# Patient Record
Sex: Male | Born: 1995 | Race: Black or African American | Hispanic: No | Smoking: Never smoker
Health system: Southern US, Community
[De-identification: ages and names within clinical notes are randomized; demographics above are authoritative.]

## PROBLEM LIST (undated history)

## (undated) HISTORY — PX: OTHER SURGICAL HISTORY: SHX169

---

## 2014-12-19 ENCOUNTER — Emergency Department (HOSPITAL_COMMUNITY): Payer: Medicaid Other

## 2014-12-19 ENCOUNTER — Emergency Department (HOSPITAL_COMMUNITY)
Admission: EM | Admit: 2014-12-19 | Discharge: 2014-12-19 | Disposition: A | Discharge status: Home / Self Care | Payer: Medicaid Other | Attending: Emergency Medicine | Admitting: Emergency Medicine

## 2014-12-19 ENCOUNTER — Encounter (HOSPITAL_COMMUNITY): Payer: Self-pay | Admitting: Nurse Practitioner

## 2014-12-19 DIAGNOSIS — R52 Pain, unspecified: Secondary | ICD-10-CM

## 2014-12-19 DIAGNOSIS — Y9389 Activity, other specified: Secondary | ICD-10-CM | POA: Insufficient documentation

## 2014-12-19 DIAGNOSIS — Y92410 Unspecified street and highway as the place of occurrence of the external cause: Secondary | ICD-10-CM | POA: Insufficient documentation

## 2014-12-19 DIAGNOSIS — S79912A Unspecified injury of left hip, initial encounter: Secondary | ICD-10-CM | POA: Insufficient documentation

## 2014-12-19 DIAGNOSIS — Y998 Other external cause status: Secondary | ICD-10-CM | POA: Diagnosis not present

## 2014-12-19 MED ORDER — IBUPROFEN 800 MG PO TABS: 800.0000 mg | ORAL_TABLET | Freq: Three times a day (TID) | ORAL | Status: AC | PRN
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
## 2014-12-19 NOTE — ED Notes (Signed)
Declined W/C at D/C and was escorted to lobby by RN. 

## 2015-01-24 ENCOUNTER — Emergency Department (HOSPITAL_COMMUNITY)
Admission: EM | Admit: 2015-01-24 | Discharge: 2015-01-24 | Disposition: A | Discharge status: Home / Self Care | Payer: Medicaid Other | Attending: Emergency Medicine | Admitting: Emergency Medicine

## 2015-01-24 ENCOUNTER — Encounter (HOSPITAL_COMMUNITY): Payer: Self-pay | Admitting: Emergency Medicine

## 2015-01-24 DIAGNOSIS — B009 Herpesviral infection, unspecified: Secondary | ICD-10-CM | POA: Diagnosis not present

## 2015-01-24 DIAGNOSIS — Z202 Contact with and (suspected) exposure to infections with a predominantly sexual mode of transmission: Secondary | ICD-10-CM | POA: Insufficient documentation

## 2015-01-24 DIAGNOSIS — R21 Rash and other nonspecific skin eruption: Secondary | ICD-10-CM | POA: Diagnosis not present

## 2015-01-24 LAB — URINALYSIS, ROUTINE W REFLEX MICROSCOPIC
Bilirubin Urine: NEGATIVE
Glucose, UA: NEGATIVE mg/dL
HGB URINE DIPSTICK: NEGATIVE
Ketones, ur: NEGATIVE mg/dL
Leukocytes, UA: NEGATIVE
Nitrite: NEGATIVE
Protein, ur: 30 mg/dL — AB
SPECIFIC GRAVITY, URINE: 1.029 (ref 1.005–1.030)
Urobilinogen, UA: 1 mg/dL (ref 0.0–1.0)
pH: 8 (ref 5.0–8.0)

## 2015-01-24 LAB — URINE MICROSCOPIC-ADD ON

## 2015-01-24 MED ORDER — LIDOCAINE HCL (PF) 1 % IJ SOLN
0.9000 mL | Freq: Once | INTRAMUSCULAR | Status: AC
  Administered 2015-01-24: 0.9 mL
  Filled 2015-01-24: qty 5

## 2015-01-24 MED ORDER — AZITHROMYCIN 250 MG PO TABS
1000.0000 mg | ORAL_TABLET | Freq: Once | ORAL | Status: AC
Start: 2015-01-24 — End: 2015-01-24
  Administered 2015-01-24: 1000 mg via ORAL
  Filled 2015-01-24: qty 4

## 2015-01-24 MED ORDER — VALACYCLOVIR HCL 1 G PO TABS: 1000.0000 mg | ORAL_TABLET | Freq: Two times a day (BID) | ORAL | Status: AC

## 2015-01-24 MED ORDER — CEFTRIAXONE SODIUM 250 MG IJ SOLR
250.0000 mg | Freq: Once | INTRAMUSCULAR | Status: AC
  Administered 2015-01-24: 250 mg via INTRAMUSCULAR
  Filled 2015-01-24: qty 250

## 2015-01-24 NOTE — Discharge Instructions (Signed)
[facility] for future [facility]D concerns or screenings. This is the recommendation by the CDC for people with multiple sexual partners or hx of STDs. You have been treated for gonorrhea and chlamydia in the ER but the hospital will call you if lab is positive. You were tested for HIV and Syphilis, and the hospital will call you if the lab is positive.   You are also being treated for herpes. Take medication as directed. Follow up with [place] and wellness for ongoing management of this symptom. Return to the ER for changes or worsening symptoms   Genital Herpes Genital herpes is a sexually transmitted disease. This means that it is a disease passed by having sex with an infected person. There is no cure for genital herpes. The time between attacks can be months to years. The virus may live in a person but produce no problems (symptoms). This infection can be passed to a baby as it travels down the birth canal (vagina). In a newborn, this can cause central nervous system damage, eye damage, or even death. The virus that causes genital herpes is usually HSV-2 virus. The virus that causes oral herpes is usually HSV-1. The diagnosis (learning what is wrong) is made through culture results. SYMPTOMS  Usually symptoms of pain and itching begin a few days to a week after contact. It first appears as small blisters that progress to small painful ulcers which then scab over and heal after several days. It affects the outer genitalia, birth canal, cervix, penis, anal area, buttocks, and thighs. HOME CARE INSTRUCTIONS   Keep ulcerated areas dry and clean.  Take medications as directed. Antiviral medications can speed up healing. They will not prevent recurrences or cure this infection. These medications can also be taken for suppression if there are frequent recurrences.  While the infection is active, it is contagious. Avoid all sexual contact during active  infections.  Condoms may help prevent spread of the herpes virus.  Practice safe sex.  Wash your hands thoroughly after touching the genital area.  Avoid touching your eyes after touching your genital area.  Inform your caregiver if you have had genital herpes and become pregnant. It is your responsibility to insure a safe outcome for your baby in this pregnancy.  Only take over-the-counter or prescription medicines for pain, discomfort, or fever as directed by your caregiver. SEEK MEDICAL CARE IF:   You have a recurrence of this infection.  You do not respond to medications and are not improving.  You have new sources of pain or discharge which have changed from the original infection.  You have an oral temperature above 102 F (38.9 C).  You develop abdominal pain.  You develop eye pain or signs of eye infection. Document Released: 09/01/2000 Document Revised: 11/27/2011 Document Reviewed: 09/22/2009 [facility]Exit[facility] Patient Information 2015 [place], [place]. This information is not intended to replace advice given to you by your health care provider. Make sure you discuss any questions you have with your health care provider.  Sexually Transmitted Disease A sexually transmitted disease (STD) is a disease or infection that may be passed (transmitted) from person to person, usually during sexual activity. This may happen by way of saliva, semen, blood, vaginal mucus, or urine. Common STDs include:   Gonorrhea.   Chlamydia.   Syphilis.   HIV and AIDS.   Genital herpes.   Hepatitis B and C.   Trichomonas.   Human papillomavirus (HPV).  Pubic lice.   Scabies.  Mites.  Bacterial vaginosis. WHAT ARE CAUSES OF STDs? An STD may be caused by bacteria, a virus, or parasites. STDs are often transmitted during sexual activity if one person is infected. However, they may also be transmitted through nonsexual means. STDs may be transmitted after:   Sexual intercourse  with an infected person.   Sharing sex toys with an infected person.   Sharing needles with an infected person or using unclean piercing or tattoo needles.  Having intimate contact with the genitals, mouth, or rectal areas of an infected person.   Exposure to infected fluids during birth. WHAT ARE THE SIGNS AND SYMPTOMS OF STDs? Different STDs have different symptoms. Some people may not have any symptoms. If symptoms are present, they may include:   Painful or bloody urination.   Pain in the pelvis, abdomen, vagina, anus, throat, or eyes.   A skin rash, itching, or irritation.  Growths, ulcerations, blisters, or sores in the genital and anal areas.  Abnormal vaginal discharge with or without bad odor.   Penile discharge in men.   Fever.   Pain or bleeding during sexual intercourse.   Swollen glands in the groin area.   Yellow skin and eyes (jaundice). This is seen with hepatitis.   Swollen testicles.  Infertility.  Sores and blisters in the mouth. HOW ARE STDs DIAGNOSED? To make a diagnosis, your health care provider may:   Take a medical history.   Perform a physical exam.   Take a sample of any discharge to examine.  Swab the throat, cervix, opening to the penis, rectum, or vagina for testing.  Test a sample of your first morning urine.   Perform blood tests.   Perform a Pap test, if this applies.   Perform a colposcopy.   Perform a laparoscopy.  HOW ARE STDs TREATED? Treatment depends on the STD. Some STDs may be treated but not cured.   Chlamydia, gonorrhea, trichomonas, and syphilis can be cured with antibiotic medicine.   Genital herpes, hepatitis, and HIV can be treated, but not cured, with prescribed medicines. The medicines lessen symptoms.   Genital warts from HPV can be treated with medicine or by freezing, burning (electrocautery), or surgery. Warts may come back.   HPV cannot be cured with medicine or surgery.  However, abnormal areas may be removed from the cervix, vagina, or vulva.   If your diagnosis is confirmed, your recent sexual partners need treatment. This is true even if they are symptom-free or have a negative culture or evaluation. They should not have sex until their health care providers say it is okay. HOW CAN I REDUCE MY RISK OF GETTING AN STD? Take these steps to reduce your risk of getting an STD:  Use latex condoms, dental dams, and water-soluble lubricants during sexual activity. Do not use petroleum jelly or oils.  Avoid having multiple sex partners.  Do not have sex with someone who has other sex partners.  Do not have sex with anyone you do not know or who is at high risk for an STD.  Avoid risky sex practices that can break your skin.  Do not have sex if you have open sores on your mouth or skin.  Avoid drinking too much alcohol or taking illegal drugs. Alcohol and drugs can affect your judgment and put you in a vulnerable position.  Avoid engaging in oral and anal sex acts.  Get vaccinated for HPV and hepatitis. If you have not received  these vaccines in the past, talk to your health care provider about whether one or both might be right for you.   If you are at risk of being infected with HIV, it is recommended that you take a prescription medicine daily to prevent HIV infection. This is called pre-exposure prophylaxis (PrEP). You are considered at risk if:  You are a man who has sex with other men (MSM).  You are a heterosexual man or woman and are sexually active with more than one partner.  You take drugs by injection.  You are sexually active with a partner who has HIV.  Talk with your health care provider about whether you are at high risk of being infected with HIV. If you choose to begin PrEP, you should first be tested for HIV. You should then be tested every 3 months for as long as you are taking PrEP.  WHAT SHOULD I DO IF I THINK I HAVE AN  STD?  See your health care provider.   Tell your sexual partner(s). They should be tested and treated for any STDs.  Do not have sex until your health care provider says it is okay. WHEN SHOULD I GET IMMEDIATE MEDICAL CARE? Contact your health care provider right away if:   You have severe abdominal pain.  You are a man and notice swelling or pain in your testicles.  You are a woman and notice swelling or pain in your vagina. Document Released: 11/25/2002 Document Revised: 09/09/2013 Document Reviewed: 03/25/2013 [facility] Patient Information 2015 [place], [place]. This information is not intended to replace advice given to you by your health care provider. Make sure you discuss any questions you have with your health care provider.  Safe Sex Safe sex is about reducing the risk of giving or getting a sexually transmitted disease (STD). STDs are spread through sexual contact involving the genitals, mouth, or rectum. Some STDs can be cured and others cannot. Safe sex can also prevent unintended pregnancies.  WHAT ARE SOME SAFE SEX PRACTICES?  Limit your sexual activity to only one partner who is having sex with only you.  Talk to your partner about his or her past partners, past STDs, and drug use.  Use a condom every time you have sexual intercourse. This includes vaginal, oral, and anal sexual activity. Both females and males should wear condoms during oral sex. Only use latex or polyurethane condoms and water-based lubricants. Using petroleum-based lubricants or oils to lubricate a condom will weaken the condom and increase the chance that it will break. The condom should be in place from the beginning to the end of sexual activity. Wearing a condom reduces, but does not completely eliminate, your risk of getting or giving an STD. STDs can be spread by contact with infected body fluids and skin.  Get vaccinated for hepatitis B and HPV.  Avoid alcohol and recreational drugs, which can  affect your judgment. You may forget to use a condom or participate in high-risk sex.  For females, avoid douching after sexual intercourse. Douching can spread an infection farther into the reproductive tract.  Check your body for signs of sores, blisters, rashes, or unusual discharge. See your health care provider if you notice any of these signs.  Avoid sexual contact if you have symptoms of an infection or are being treated for an STD. If you or your partner has herpes, avoid sexual contact when blisters are present. Use condoms at all other times.  If you are at risk of  being infected with HIV, it is recommended that you take a prescription medicine daily to prevent HIV infection. This is called pre-exposure prophylaxis (PrEP). You are considered at risk if:  You are a man who has sex with other men (MSM).  You are a heterosexual man or woman who is sexually active with more than one partner.  You take drugs by injection.  You are sexually active with a partner who has HIV.  Talk with your health care provider about whether you are at high risk of being infected with HIV. If you choose to begin PrEP, you should first be tested for HIV. You should then be tested every 3 months for as long as you are taking PrEP.  See your health care provider for regular screenings, exams, and tests for other STDs. Before having sex with a new partner, each of you should be screened for STDs and should talk about the results with each other. WHAT ARE THE BENEFITS OF SAFE SEX?   There is less chance of getting or giving an STD.  You can prevent unwanted or unintended pregnancies.  By discussing safe sex concerns with your partner, you may increase feelings of intimacy, comfort, trust, and honesty between the two of you. Document Released: 10/12/2004 Document Revised: 01/19/2014 Document Reviewed: 02/26/2012 [facility] Patient Information 2015 [place], [place]. This information is not intended to replace  advice given to you by your health care provider. Make sure you discuss any questions you have with your health care provider.

## 2015-01-24 NOTE — ED Provider Notes (Signed)
CSN: [id number]     Arrival date & time 01/24/15  1407 History  This chart was scribed for Levi StraussMercedes Camprubi-Soms PA-C working with Blake DivineJohn Wofford, MD by Elveria Risingimelie Horne, ED Scribe. This patient was seen in room TR04C/TR04C and the patient's care was started at 2:37 PM.   Chief Complaint  Patient presents with  . Exposure to STD  . Rash   Patient is a 19 y.o. male presenting with STD exposure and rash. The history is provided by the patient. No language interpreter was used.  Exposure to STD This is a new problem. Episode onset: unknown. The problem occurs constantly. The problem has not changed since onset.Pertinent negatives include no chest pain, no abdominal pain and no shortness of breath. Nothing aggravates the symptoms. Nothing relieves the symptoms. He has tried nothing for the symptoms. The treatment provided no relief.  Rash Associated symptoms: no abdominal pain, no diarrhea, no fever, no joint pain, no myalgias, no nausea, no shortness of breath and not vomiting    HPI Comments: Darald Jenelle MagesHairston is a [age] y.o. male who presents to the Emergency Department complaining of nonpruritic, nonpainful, nonerythematous genital rash that looks like small bumps. Patient shares having unprotected sex with single male partner for one year; she was recently diagnosed with Chlamydia after experiencing ongoing vaginal discharge. Patient denies fever, chills, eye redness or pain, shortness of breath, chest pain, abdominal pain, nausea, vomiting, dysuria, hematuria, penile pain or discharge, penile itching, testicular pain or swelling, arthralgias, myalgias, numbness, tingling or weakness, or genital ulcers.     History reviewed. No pertinent past medical history. Past Surgical History  Procedure Laterality Date  . Arm surgery Right    No family history on file. History  Substance Use Topics  . Smoking status: Never Smoker   . Smokeless tobacco: Not on file  . Alcohol Use: No    Review of Systems   Constitutional: Negative for fever and chills.  Eyes: Negative for pain and redness.  Respiratory: Negative for shortness of breath.   Cardiovascular: Negative for chest pain.  Gastrointestinal: Negative for nausea, vomiting, abdominal pain and diarrhea.  Genitourinary: Positive for genital sores ("bumps"). Negative for dysuria, hematuria, discharge, penile swelling, scrotal swelling, penile pain and testicular pain.  Musculoskeletal: Negative for myalgias, joint swelling and arthralgias.  Skin: Positive for rash (penis). Negative for color change.  Allergic/Immunologic: Negative for immunocompromised state.  Neurological: Negative for weakness and numbness.  Psychiatric/Behavioral: Negative for confusion.  A complete 10 system review of systems was obtained and all systems are negative except as noted in the HPI and PMH.    Allergies  Review of patient's allergies indicates no known allergies.  Home Medications   Prior to Admission medications   Medication Sig Start Date End Date Taking? Authorizing Provider  ibuprofen (ADVIL,MOTRIN) 800 MG tablet Take 1 tablet (800 mg total) by mouth every 8 (eight) hours as needed for mild pain or moderate pain. 12/19/14   Trixie DredgeEmily West, PA-C   Triage Vitals: BP 116/68 mmHg  Pulse 87  Temp(Src) 98 F (36.7 C) (Oral)  Resp 17  Ht 5\' 10"  (1.778 m)  Wt 120 lb 3.2 oz (54.522 kg)  BMI 17.25 kg/m2  SpO2 97% Physical Exam  Constitutional: He is oriented to person, place, and time. Vital signs are normal. He appears well-developed and well-nourished.  Non-toxic appearance. No distress.  Afebrile, nontoxic, NAD  HENT:  Head: Normocephalic and atraumatic.  Mouth/Throat: Mucous membranes are normal.  Eyes: Conjunctivae and EOM are normal.  Right eye exhibits no discharge. Left eye exhibits no discharge.  Neck: Normal range of motion. Neck supple.  Cardiovascular: Normal rate.   Pulmonary/Chest: Effort normal. No respiratory distress.  Abdominal: Normal  appearance. He exhibits no distension. Hernia confirmed negative in the right inguinal area and confirmed negative in the left inguinal area.  Genitourinary: Testes normal. Right testis shows no mass, no swelling and no tenderness. Left testis shows no mass, no swelling and no tenderness. Circumcised. No phimosis, paraphimosis, hypospadias, penile erythema or penile tenderness. No discharge found.  Chaperone present for exam Circumcised penis without phimosis/paraphimosis, hypospadias, erythema, tenderness, or discharge. Testes with no masses or tenderness, no swelling, and cremasterics reflex present bilaterally. No inguinal hernias or adenopathy present. Diffuse somewhat fine-appearing vesicular rash over entire shaft of penis, with no erythema or drainage, and no TTP. No ulcerations noted. No central umbilication of lesions  Musculoskeletal: Normal range of motion.  Neurological: He is alert and oriented to person, place, and time. He has normal strength. No sensory deficit.  Skin: Skin is warm, dry and intact. No rash noted.  Psychiatric: He has a normal mood and affect.  Nursing note and vitals reviewed.   ED Course  Procedures (including critical care time)  COORDINATION OF CARE: 2:43 PM- Discussed treatment plan with patient at bedside and patient agreed to plan.   Labs Review Labs Reviewed  URINALYSIS, ROUTINE W REFLEX MICROSCOPIC - Abnormal; Notable for the following:    Color, Urine AMBER (*)    Protein, ur 30 (*)    All other components within normal limits  URINE CULTURE  URINE MICROSCOPIC-ADD ON  RPR  HIV ANTIBODY (ROUTINE TESTING)  GC/CHLAMYDIA PROBE AMP ([place])    Imaging Review No results found.   EKG Interpretation None      MDM   Final diagnoses:  Exposure to STD  Penile rash  Herpes    19 y.o. male here with concern about STD exposure. Will obtain STD labs and treat empirically, although pt has no symptoms. He does however have a fine slightly  vesicular rash to his penis shaft, nonpainful or erythematous, does not have ulcerations or central umbilications, doesn't entirely appear like herpes although this could be early herpes. Will treat as this, but can't culture since there are no large vesicles to open and culture. Will have him f/up with [facility] for ongoing care of this. Will get U/A and as long as no abnormalities, will discharge with these instructions.  4:30 PM U/A clear. Will discharge with valtrex and [facility] follow up. I explained the diagnosis and have given explicit precautions to return to the ER including for any other new or worsening symptoms. The patient understands and accepts the medical plan as it's been dictated and I have answered their questions. Discharge instructions concerning home care and prescriptions have been given. The patient is STABLE and is discharged to home in good condition.    I personally performed the services described in this documentation, which was scribed in my presence. The recorded information has been reviewed and is accurate.  BP 116/68 mmHg  Pulse 87  Temp(Src) 98 F (36.7 C) (Oral)  Resp 17  Ht  (1.778 m)  Wt 120 lb 3.2 oz (54.522 kg)  BMI 17.25 kg/m2  SpO2 97%  Meds ordered this encounter  Medications  . azithromycin (ZITHROMAX) tablet 1,000 mg    Sig:    And  . cefTRIAXone (ROCEPHIN) injection 250 mg    Sig:  Order Specific Question:  Antibiotic Indication:    Answer:  STD  . lidocaine (PF) (XYLOCAINE) 1 % injection 0.9 mL    Sig:   . valACYclovir (VALTREX) 1000 MG tablet    Sig: Take 1 tablet (1,000 mg total) by mouth 2 (two) times daily. X 10 days    Dispense:  20 tablet    Refill:  0    Order Specific Question:  Supervising Provider    Answer:  Eber HongMILLER, BRIAN [[id number]]      Elina Streng Camprubi-Soms, PA-C 01/24/15 1633  Blake DivineJohn Wofford, MD 01/28/15 [phone]

## 2015-01-24 NOTE — ED Notes (Signed)
Pt reports that his sexual partner was told she had chlamydia recently. Pt has rash to genital area.

## 2015-01-25 LAB — GC/CHLAMYDIA PROBE AMP (~~LOC~~) NOT AT ARMC
CHLAMYDIA, DNA PROBE: POSITIVE — AB
Neisseria Gonorrhea: NEGATIVE

## 2015-01-25 LAB — HIV ANTIBODY (ROUTINE TESTING W REFLEX): HIV SCREEN 4TH GENERATION: NONREACTIVE

## 2015-01-25 LAB — RPR: RPR Ser Ql: NONREACTIVE

## 2015-01-26 LAB — URINE CULTURE: Colony Count: 25000

## 2015-01-27 ENCOUNTER — Telehealth (HOSPITAL_BASED_OUTPATIENT_CLINIC_OR_DEPARTMENT_OTHER): Payer: Self-pay | Admitting: Emergency Medicine

## 2015-01-28 ENCOUNTER — Ambulatory Visit: Payer: Medicaid Other | Attending: Physician Assistant | Admitting: Physician Assistant

## 2015-01-28 ENCOUNTER — Telehealth (HOSPITAL_BASED_OUTPATIENT_CLINIC_OR_DEPARTMENT_OTHER): Payer: Self-pay | Admitting: Emergency Medicine

## 2015-01-28 ENCOUNTER — Encounter: Payer: Self-pay | Admitting: Physician Assistant

## 2015-01-28 VITALS — BP 156/78 | HR 95 | Wt 123.0 lb

## 2015-01-28 DIAGNOSIS — R21 Rash and other nonspecific skin eruption: Secondary | ICD-10-CM | POA: Diagnosis not present

## 2015-01-28 NOTE — Progress Notes (Signed)
   Louis Graham  [id number]  [id number]  DOB - January 13, 1996  Chief Complaint  Patient presents with  . New patient    est. care rash in groin area       Subjective:   Louis Graham is a 19 y.o. male here today for establishment of care. He was in the emergency department on 01/24/2015 with complaints of a rash. Intermittently for the last 4 years he develops a small macular papular rash on his chart especially to the right side. Now he feels that some of it is on his shaft of his penile area. No discharge. No pain. No discomfort. No problems urinating.  He also explained that recently he was exposed to chlamydia and wanted to be checked. His DNA probe was positive for chlamydia only. He was treated with azithromycin in the emergency department. He also was given ceftriaxone, however his gonorrhea test was negative. HIV was negative.  ROS: GEN: denies fever or chills, denies change in weight Skin: + esions or rashes GU:+rash, no pain, urinary sxs, no discharge  Problem  Rash and Nonspecific Skin Eruption    ALLERGIES: No Known Allergies  PAST MEDICAL HISTORY: NONE  PAST SURGICAL HISTORY: Past Surgical History  Procedure Laterality Date  . Arm surgery Right     MEDICATIONS AT HOME: Prior to Admission medications   Medication Sig Start Date End Date Taking? Authorizing Provider  ibuprofen (ADVIL,MOTRIN) 800 MG tablet Take 1 tablet (800 mg total) by mouth every 8 (eight) hours as needed for mild pain or moderate pain. Patient not taking: Reported on 01/28/2015 12/19/14   Trixie DredgeEmily West, PA-C  valACYclovir (VALTREX) 1000 MG tablet Take 1 tablet (1,000 mg total) by mouth 2 (two) times daily. X 10 days Patient not taking: Reported on 01/28/2015 01/24/15   Mercedes Camprubi-Soms, PA-C     Objective:   Filed Vitals:   01/28/15 1458  BP: 156/78  Pulse: 95  Weight: 123 lb (55.792 kg)  SpO2: 98%    Exam General appearance : Awake, alert, not in any distress. Speech  Clear. Not toxic lookingl Skin:macularpapular lesions on right side of trunk, flesh colored, nontender, no drainage OZ:DGUYGU:smae as above on shaft of penus    Assessment & Plan  1. Skin rash - ? Contact dermatitis  -referral to Dermatology 2. Recent STD exposure (Chlamydia)-treated  F/U: prn  The patient was given clear instructions to go to ER or return to medical center if symptoms don't improve, worsen or new problems develop. The patient verbalized understanding. The patient was told to call to get lab results if they haven't heard anything in the next week.   This note has been created with Education officer, environmentalDragon speech recognition software and smart phrase technology. Any transcriptional errors are unintentional.    Scot Juniffany Blakeleigh Domek, PA-C [facility] [place], [place] [phone]   01/28/2015, 3:14 PM

## 2015-02-16 ENCOUNTER — Emergency Department (HOSPITAL_COMMUNITY)
Admission: EM | Admit: 2015-02-16 | Discharge: 2015-02-16 | Disposition: A | Discharge status: Home / Self Care | Payer: Medicaid Other | Attending: Emergency Medicine | Admitting: Emergency Medicine

## 2015-02-16 ENCOUNTER — Emergency Department (HOSPITAL_COMMUNITY): Payer: Medicaid Other

## 2015-02-16 ENCOUNTER — Encounter (HOSPITAL_COMMUNITY): Payer: Self-pay

## 2015-02-16 DIAGNOSIS — M25512 Pain in left shoulder: Secondary | ICD-10-CM | POA: Diagnosis present

## 2015-02-16 MED ORDER — MELOXICAM 15 MG PO TABS: 15.0000 mg | ORAL_TABLET | Freq: Every day | ORAL | Status: AC

## 2015-02-16 NOTE — Discharge Instructions (Signed)
Take mobic as needed for pain. Follow up with Dr. Charlann Boxerlin. Refer to attached documents for more information.

## 2015-02-16 NOTE — ED Provider Notes (Signed)
CSN: [id number]     Arrival date & time 02/16/15  1158 History  This chart was scribed for non-physician practitioner, Emilia BeckKaitlyn Casilda Pickerill, PA-C, working with Rolland PorterMark James, MD, by Lionel DecemberHatice Demirci, ED Scribe. This patient was seen in room TR10C/TR10C and the patient's care was started at 2:05 PM.    Chief Complaint  Patient presents with  . Shoulder Pain     (Consider location/radiation/quality/duration/timing/severity/associated sxs/prior Treatment) Patient is a 19 y.o. male presenting with shoulder pain. The history is provided by the patient. No language interpreter was used.  Shoulder Pain   HPI Comments: Louis Graham is a 19 y.o. male who presents to the Emergency Department complaining of left shoulder pain which has been on going for many years but states that he has been noticing it more recently.  Patient states that he feels a "cracking" and "dislocation" sensation when he moves his arm in certain directions, is lifting heavy objects or is boxing.  He denies any numbness/tingling in his arm.  He has no other questions or concerns today.    History reviewed. No pertinent past medical history. Past Surgical History  Procedure Laterality Date  . Arm surgery Right    History reviewed. No pertinent family history. History  Substance Use Topics  . Smoking status: Never Smoker   . Smokeless tobacco: Not on file  . Alcohol Use: No    Review of Systems  Musculoskeletal: Positive for myalgias.  All other systems reviewed and are negative.     Allergies  Review of patient's allergies indicates no known allergies.  Home Medications   Prior to Admission medications   Medication Sig Start Date End Date Taking? Authorizing Provider  ibuprofen (ADVIL,MOTRIN) 800 MG tablet Take 1 tablet (800 mg total) by mouth every 8 (eight) hours as needed for mild pain or moderate pain. Patient not taking: Reported on 01/28/2015 12/19/14   Trixie DredgeEmily West, PA-C  valACYclovir (VALTREX) 1000 MG tablet Take  1 tablet (1,000 mg total) by mouth 2 (two) times daily. X 10 days Patient not taking: Reported on 01/28/2015 01/24/15   Mercedes Camprubi-Soms, PA-C   BP 130/72 mmHg  Pulse 90  Temp(Src) 98.5 F (36.9 C) (Oral)  Resp 20  Ht 5\' 10"  (1.778 m)  Wt 122 lb (55.339 kg)  BMI 17.51 kg/m2  SpO2 99% Physical Exam  Constitutional: He is oriented to person, place, and time. He appears well-developed and well-nourished. No distress.  HENT:  Head: Normocephalic and atraumatic.  Eyes: Conjunctivae and EOM are normal.  Neck: Normal range of motion.  Cardiovascular: Normal rate and regular rhythm.  Exam reveals no gallop and no friction rub.   No murmur heard. Pulmonary/Chest: Effort normal and breath sounds normal. No respiratory distress. He has no wheezes. He has no rales. He exhibits no tenderness.  Abdominal: Soft. There is no tenderness.  Musculoskeletal: Normal range of motion.  Left shoulder generalized tenderness to palpation. Full ROM. No obvious deformity.   Neurological: He is alert and oriented to person, place, and time.  Speech is goal-oriented. Moves limbs without ataxia.   Skin: Skin is warm and dry.  Psychiatric: He has a normal mood and affect. His behavior is normal.  Nursing note and vitals reviewed.   ED Course  Procedures (including critical care time) DIAGNOSTIC STUDIES: Oxygen Saturation is 99% on RA, normal by my interpretation.    COORDINATION OF CARE: 2:11 PM Discussed treatment plan with patient at beside, the patient agrees with the plan and has no further questions  at this time.   Labs Review Labs Reviewed - No data to display  Imaging Review Dg Shoulder Left  02/16/2015   CLINICAL DATA:  Left shoulder pain with instability for 5 days. No recent injury. Initial encounter.  EXAM: LEFT SHOULDER - 2+ VIEW  COMPARISON:  None.  FINDINGS: The mineralization and alignment are normal. There is no evidence of acute fracture or dislocation. The subacromial space is  preserved. No significant arthropathic changes identified.  IMPRESSION: Negative left shoulder radiographs.   Electronically Signed   By: Carey Bullocks M.D.   On: 02/16/2015 13:57     EKG Interpretation None      MDM   Final diagnoses:  Left shoulder pain   Xray shows no acute changes. Patient will have ortho referral.   I personally performed the services described in this documentation, which was scribed in my presence. The recorded information has been reviewed and is accurate.    Emilia Beck, PA-C 02/18/15 2351  Rolland Porter, MD 03/02/15 [phone]

## 2015-02-16 NOTE — ED Notes (Signed)
Pt boxes and is here for his left shoulder. Has never dislocated it. When he moves it certain ways it bothers him. Denies any pain at present.

## 2015-09-12 IMAGING — CR DG SHOULDER 2+V*L*
3 series · 3 of 3 positions shown · non-contrast
Comparison: None.

CLINICAL DATA: Left shoulder pain with instability for 5 days. No
recent injury. Initial encounter.

EXAM:
LEFT SHOULDER - 2+ VIEW

[shoulder grashey]
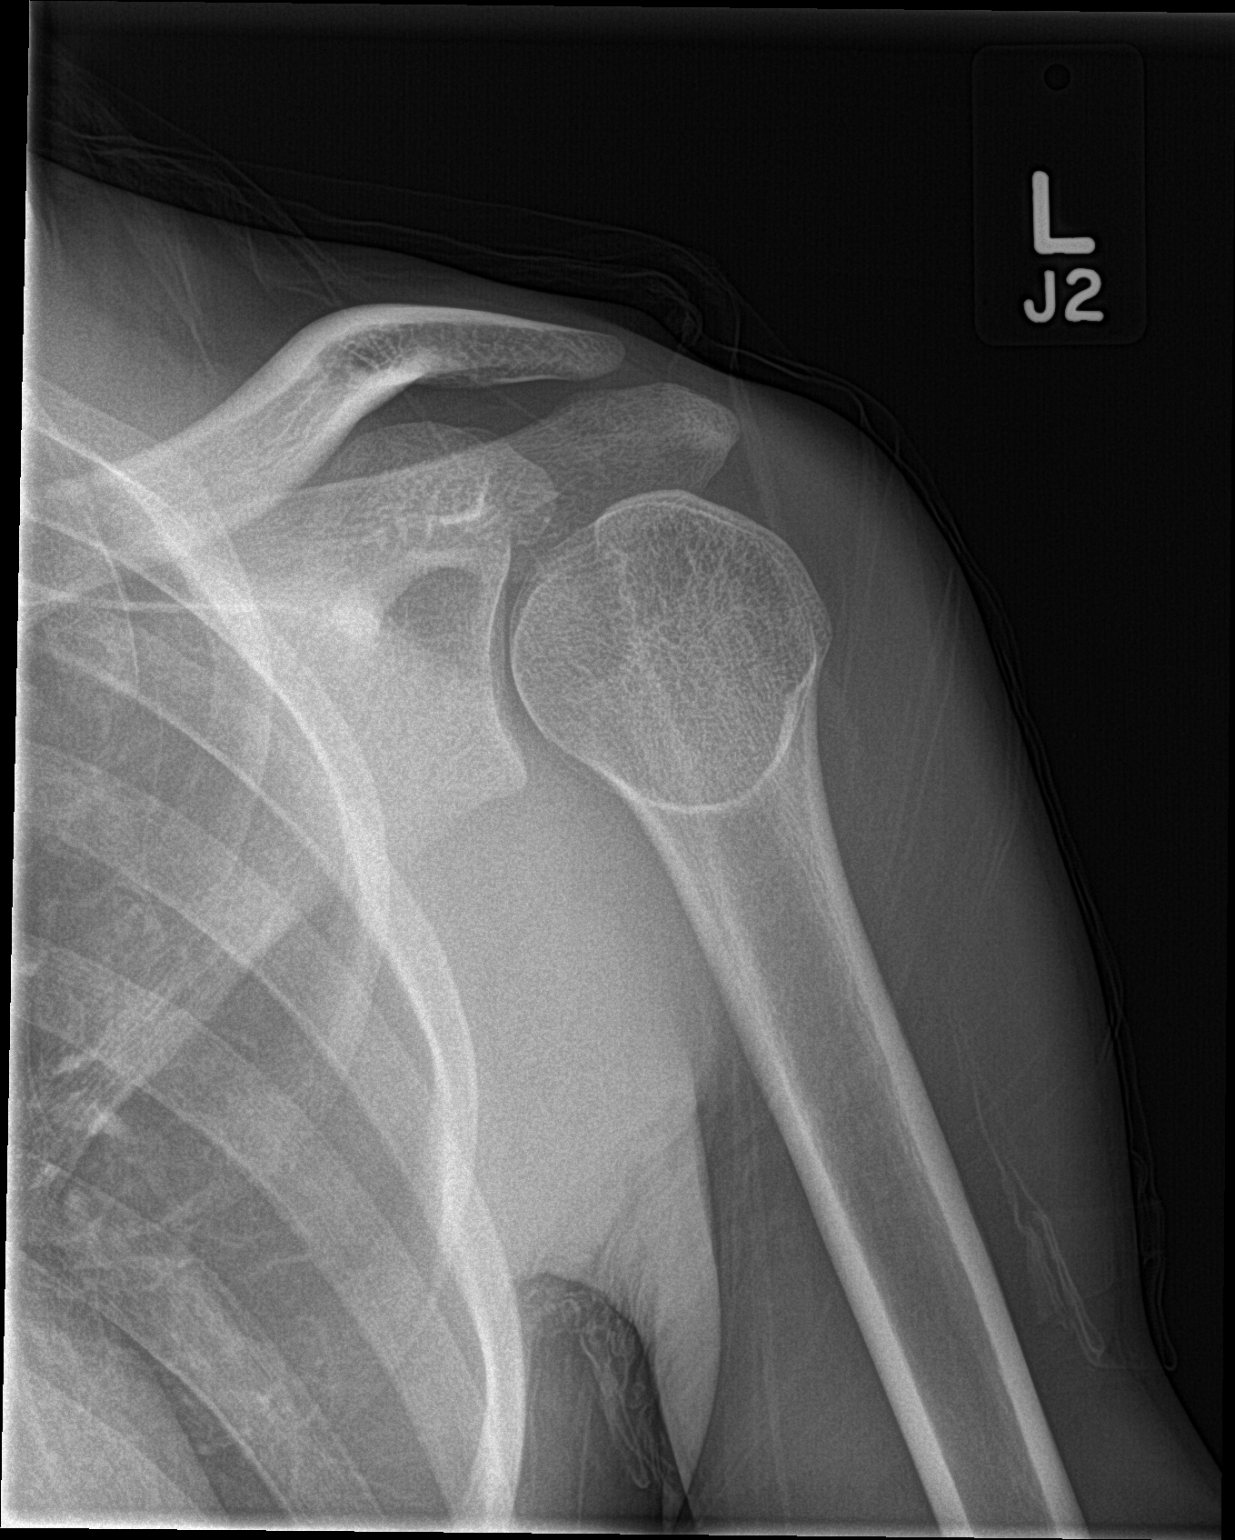

[shoulder y view]
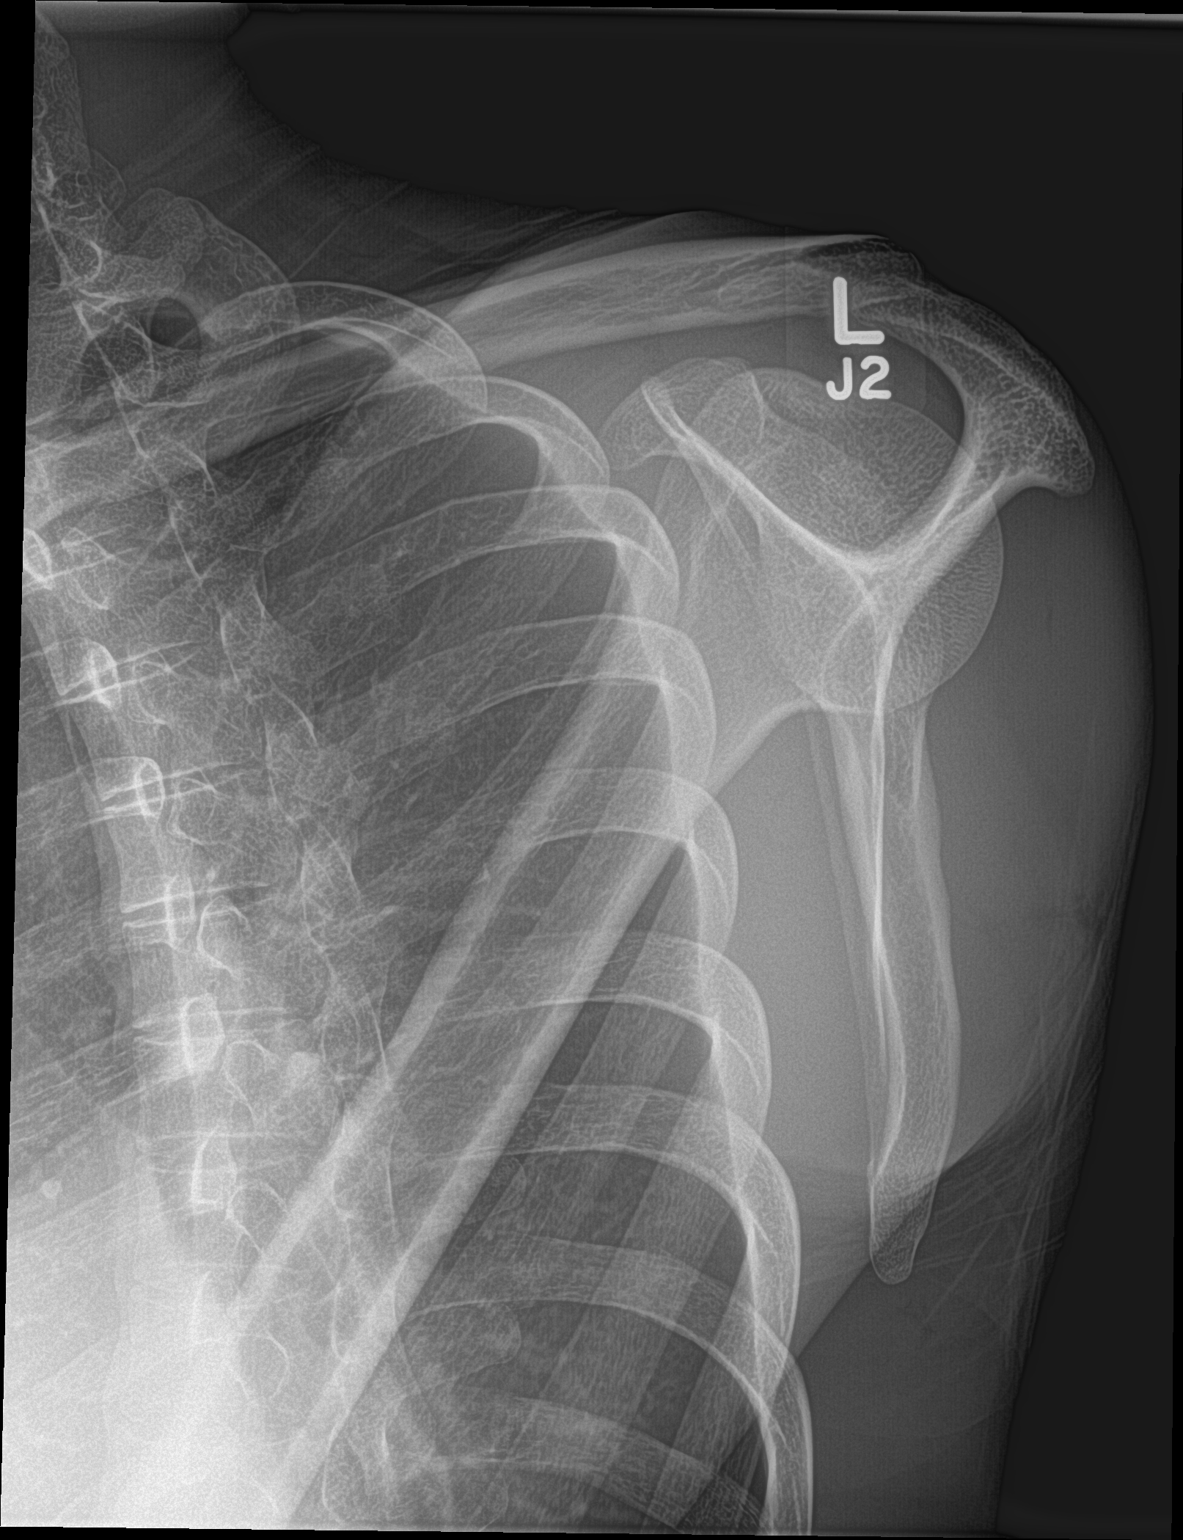

[shoulder axillary]
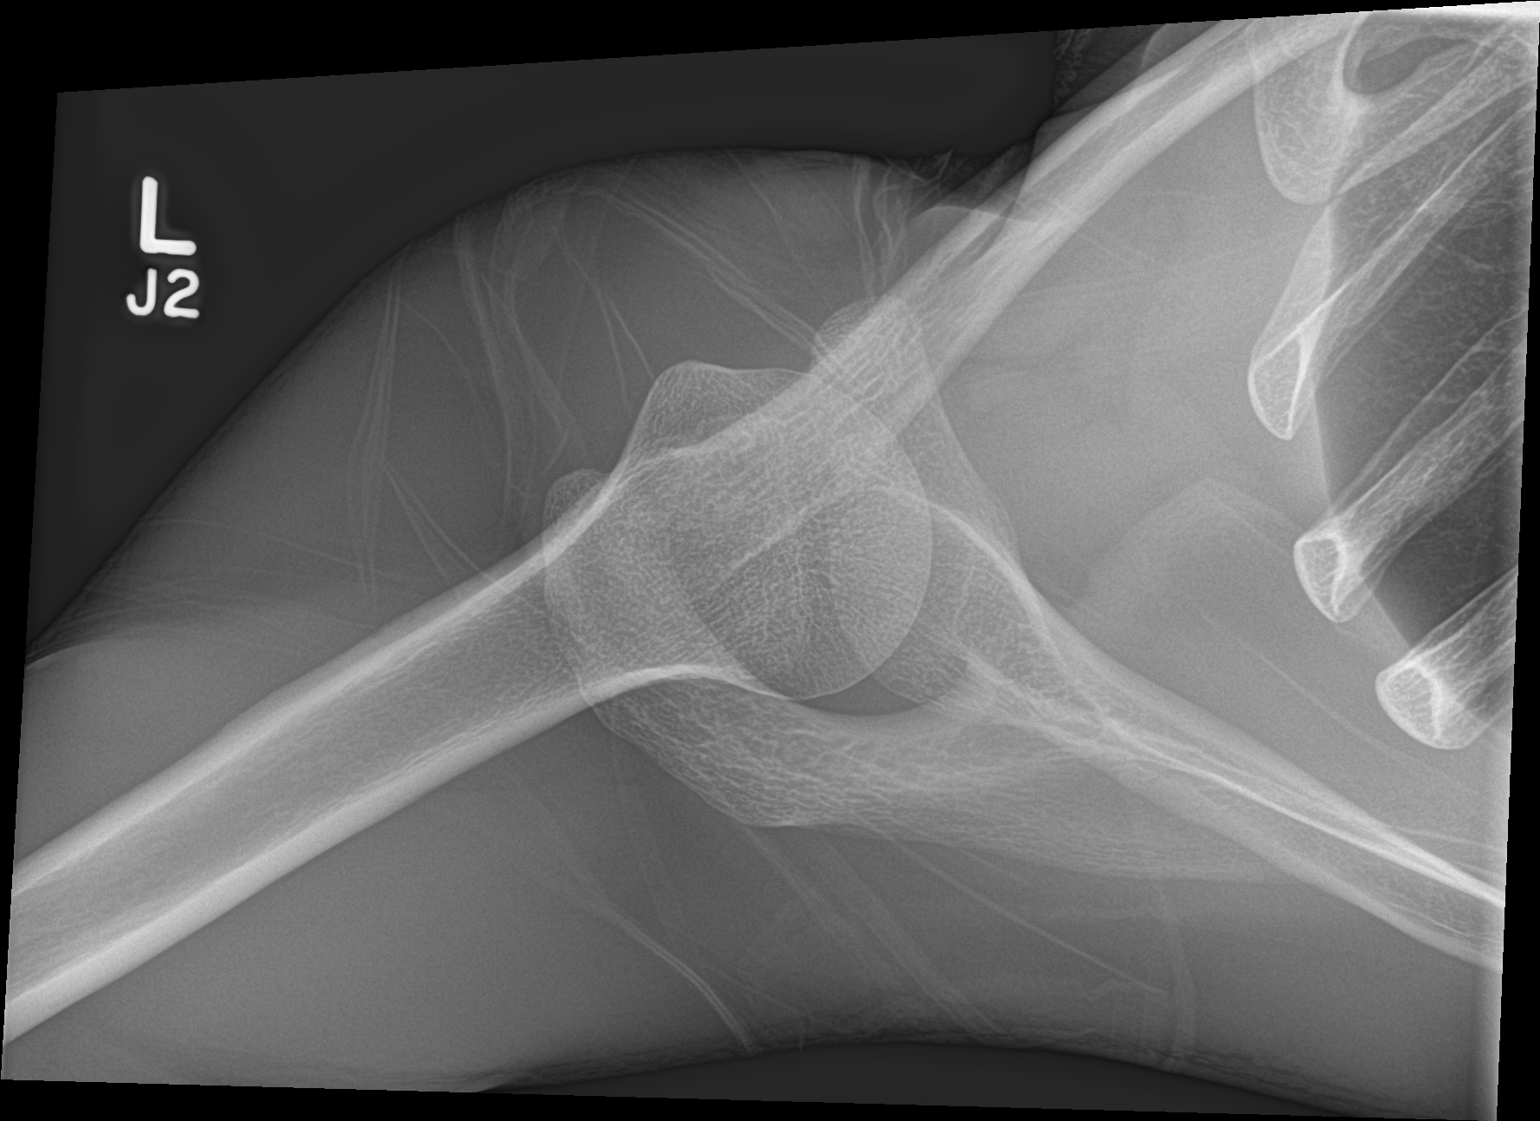

[3 of 3 positions shown; findings below may reference images not displayed]

FINDINGS: The mineralization and alignment are normal. There is no evidence of
acute fracture or dislocation. The subacromial space is preserved.
No significant arthropathic changes identified.
IMPRESSION: Negative left shoulder radiographs.
